# Patient Record
Sex: Male | Born: 1965 | Race: White | Hispanic: No | Marital: Married | Smoking: Never smoker
Health system: Southern US, Community
[De-identification: ages and names within clinical notes are randomized; demographics above are authoritative.]

## PROBLEM LIST (undated history)

## (undated) DIAGNOSIS — K219 Gastro-esophageal reflux disease without esophagitis: Secondary | ICD-10-CM

## (undated) DIAGNOSIS — M069 Rheumatoid arthritis, unspecified: Secondary | ICD-10-CM

## (undated) HISTORY — PX: WISDOM TOOTH EXTRACTION: SHX21

## (undated) HISTORY — PX: ESOPHAGOGASTRODUODENOSCOPY: SHX1529

---

## 2005-08-04 HISTORY — PX: VASECTOMY: SHX75

## 2009-08-28 ENCOUNTER — Ambulatory Visit: Payer: Self-pay | Admitting: Internal Medicine

## 2010-04-08 ENCOUNTER — Ambulatory Visit: Payer: Self-pay | Admitting: Otolaryngology

## 2010-07-29 ENCOUNTER — Ambulatory Visit: Payer: Self-pay | Admitting: Otolaryngology

## 2010-08-03 LAB — PATHOLOGY REPORT

## 2011-09-24 IMAGING — CT CT NECK WITH CONTRAST
1 of 2 series · 10 of 14 positions shown, 13 images · IV contrast (agent unspecified)
Comparison: none

REASON FOR EXAM: R SUBMANDIBULAR MASS
COMMENTS:

PROCEDURE:     CT  - CT NECK WITH CONTRAST  - April 08, 2010  [DATE]
RESULT:
TECHNIQUE: Helical 3 mm sections were obtained from the skull base through
the thoracic inlet status post intravenous administration of 75 mL
[id number].

[Series 2: soft tissue · axial · 0.48mm/px · z∈[+122,+338]mm · 10 of 89 slices shown, 13 images]
[im 9/89  soft-tissue]
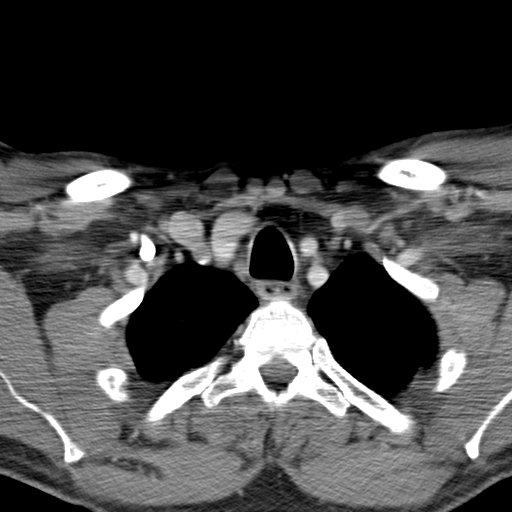
[im 9/89  bone]
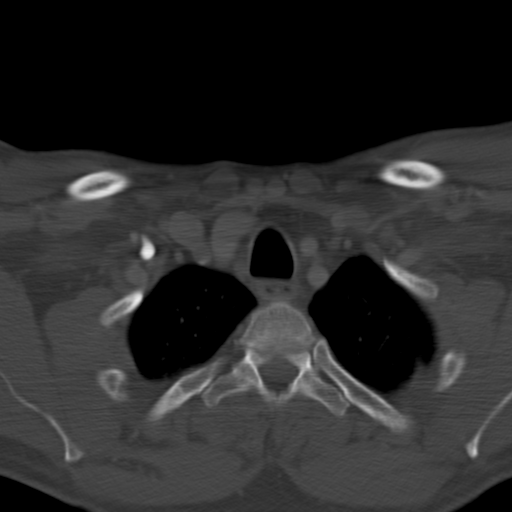
[im 17/89  bone]
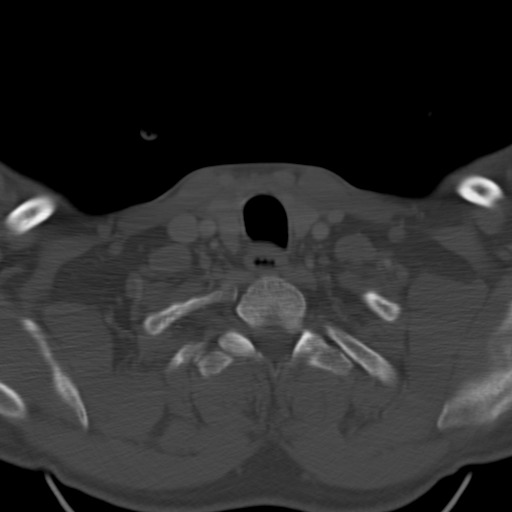
[im 25/89  bone]
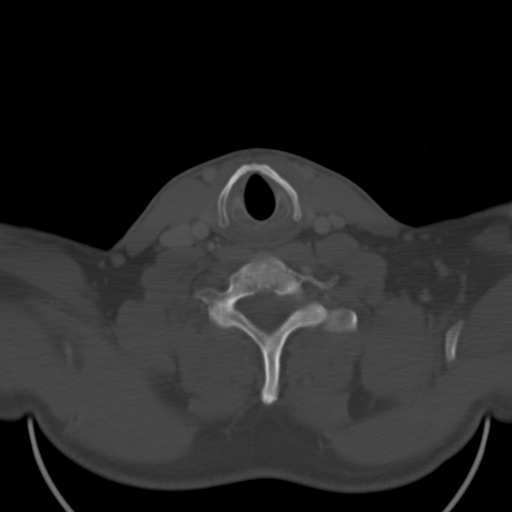
[im 33/89  bone]
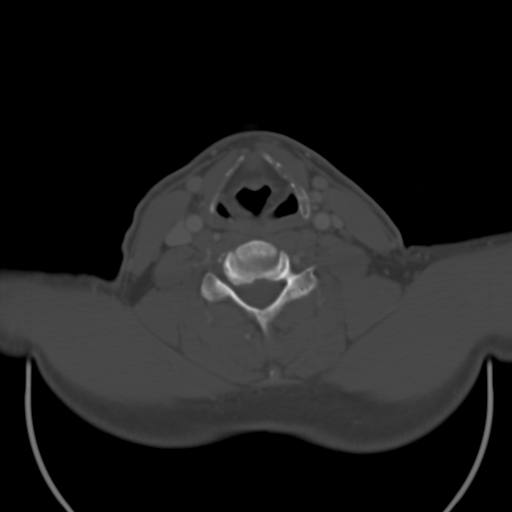
[im 41/89  soft-tissue]
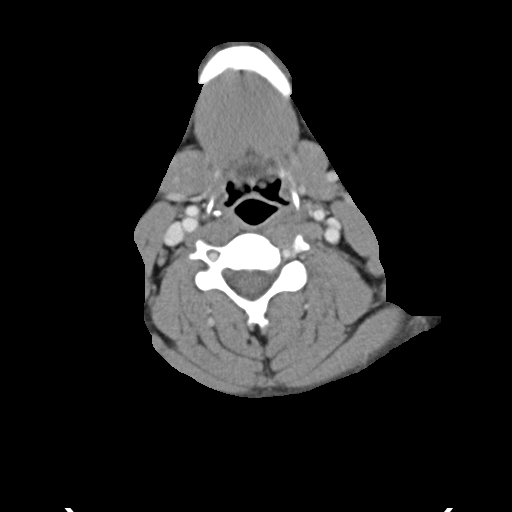
[im 41/89  bone]
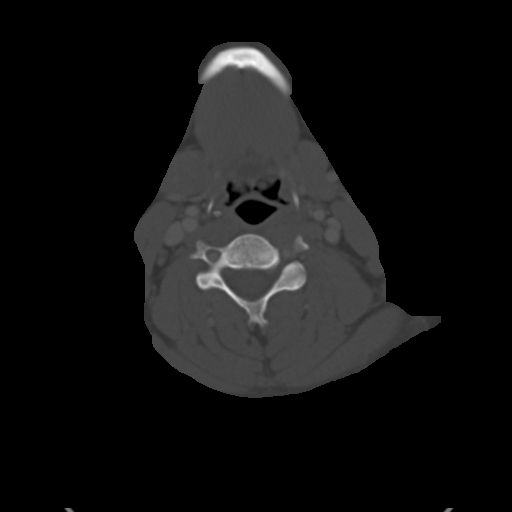
[im 49/89  bone]
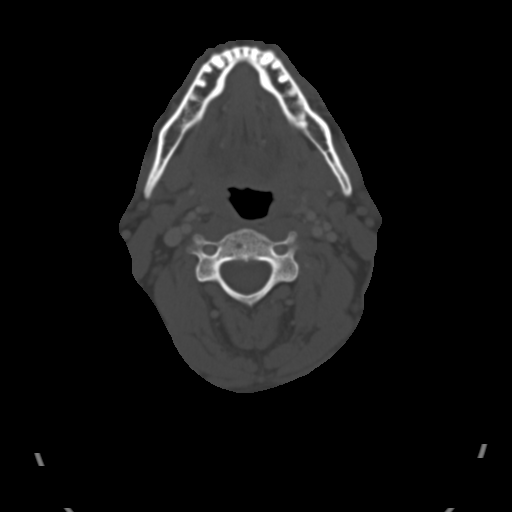
[im 57/89  bone]
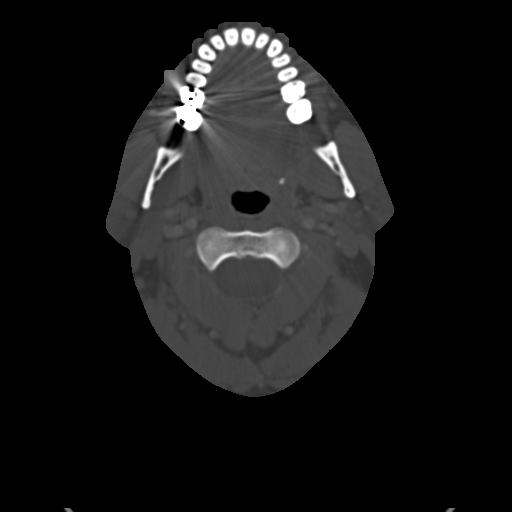
[im 65/89  bone]
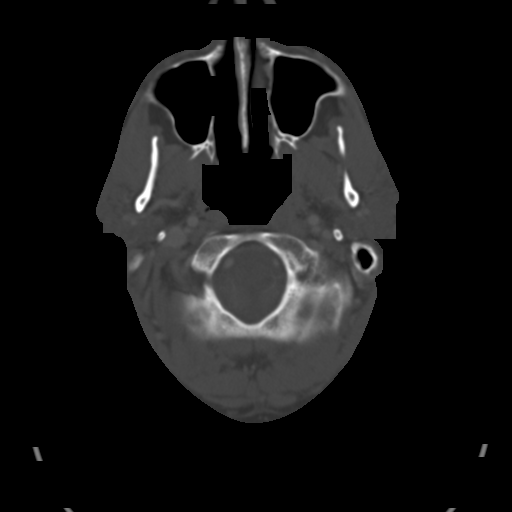
[im 73/89  soft-tissue]
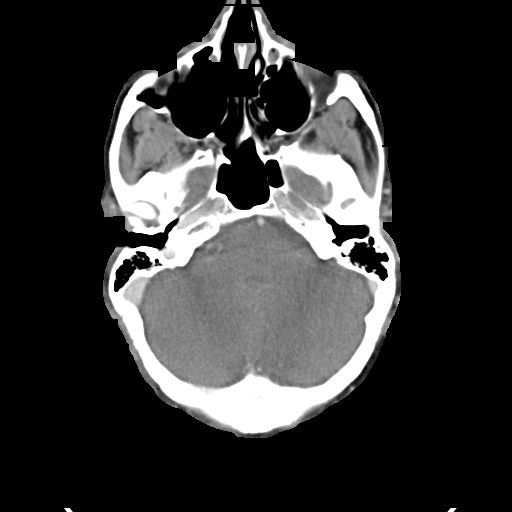
[im 73/89  bone]
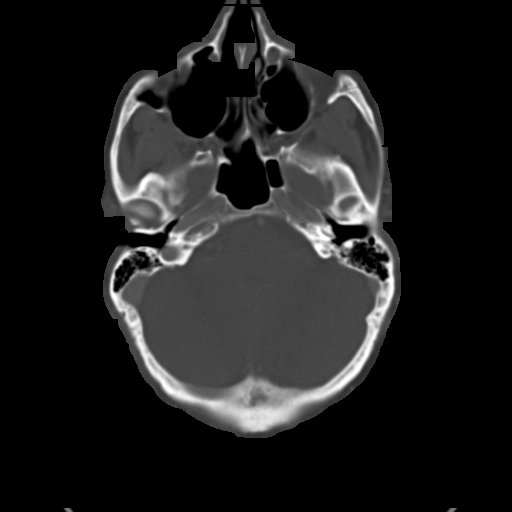
[im 81/89  bone]
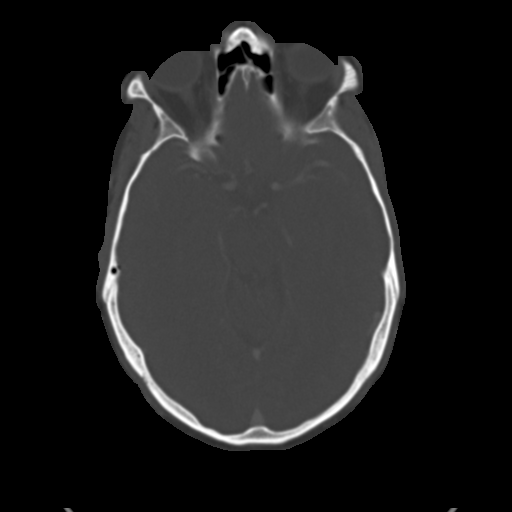

[10 of 14 positions shown; findings below may reference images not displayed]

FINDINGS: Evaluation of the right submandibular region demonstrates diffuse
prominence of the right submandibular gland which is slightly larger than
the left. No definite masses or pathologic size adenopathy is appreciated.
There is no evidence of free fluid or loculated fluid collections. Shotty
subcentimeter lymph nodes are identified within the anterior and posterior
cervical chains. The largest projects within the anterior carotid space on
the right. A rounded dystrophic appearing calcification projects within the
anterior left parapharyngeal space. This does not appear to project along
the expected course of the carotid duct nor the submandibular duct. This may
represent dystrophic calcification within soft tissues. There is no evidence
of masses, free fluid or loculated fluid collections.
IMPRESSION: Asymmetry of the submandibular glands right larger than the left. No
definite masses nor nodules are identified, nor free fluid or loculated
fluid collections. Shotty subcentimeter lymph nodes are identified within
the anterior and posterior cervical chains.

## 2011-12-22 DIAGNOSIS — M059 Rheumatoid arthritis with rheumatoid factor, unspecified: Secondary | ICD-10-CM | POA: Insufficient documentation

## 2012-02-08 ENCOUNTER — Ambulatory Visit: Payer: Self-pay

## 2013-07-26 IMAGING — CR DG CHEST 1V
1 series · 1 of 1 positions shown · non-contrast
Comparison: none

REASON FOR EXAM: r/o TB, pt with RA (714.0),FAX TO DR.ROBERTO TIGER
[phone]
COMMENTS:

PROCEDURE:     DXR - DXR CHEST 1 VIEWAP OR PA  - February 08, 2012  [DATE]
RESULT:     The lungs are clear. The cardiovascular structures are
unremarkable.

[w chest pa]
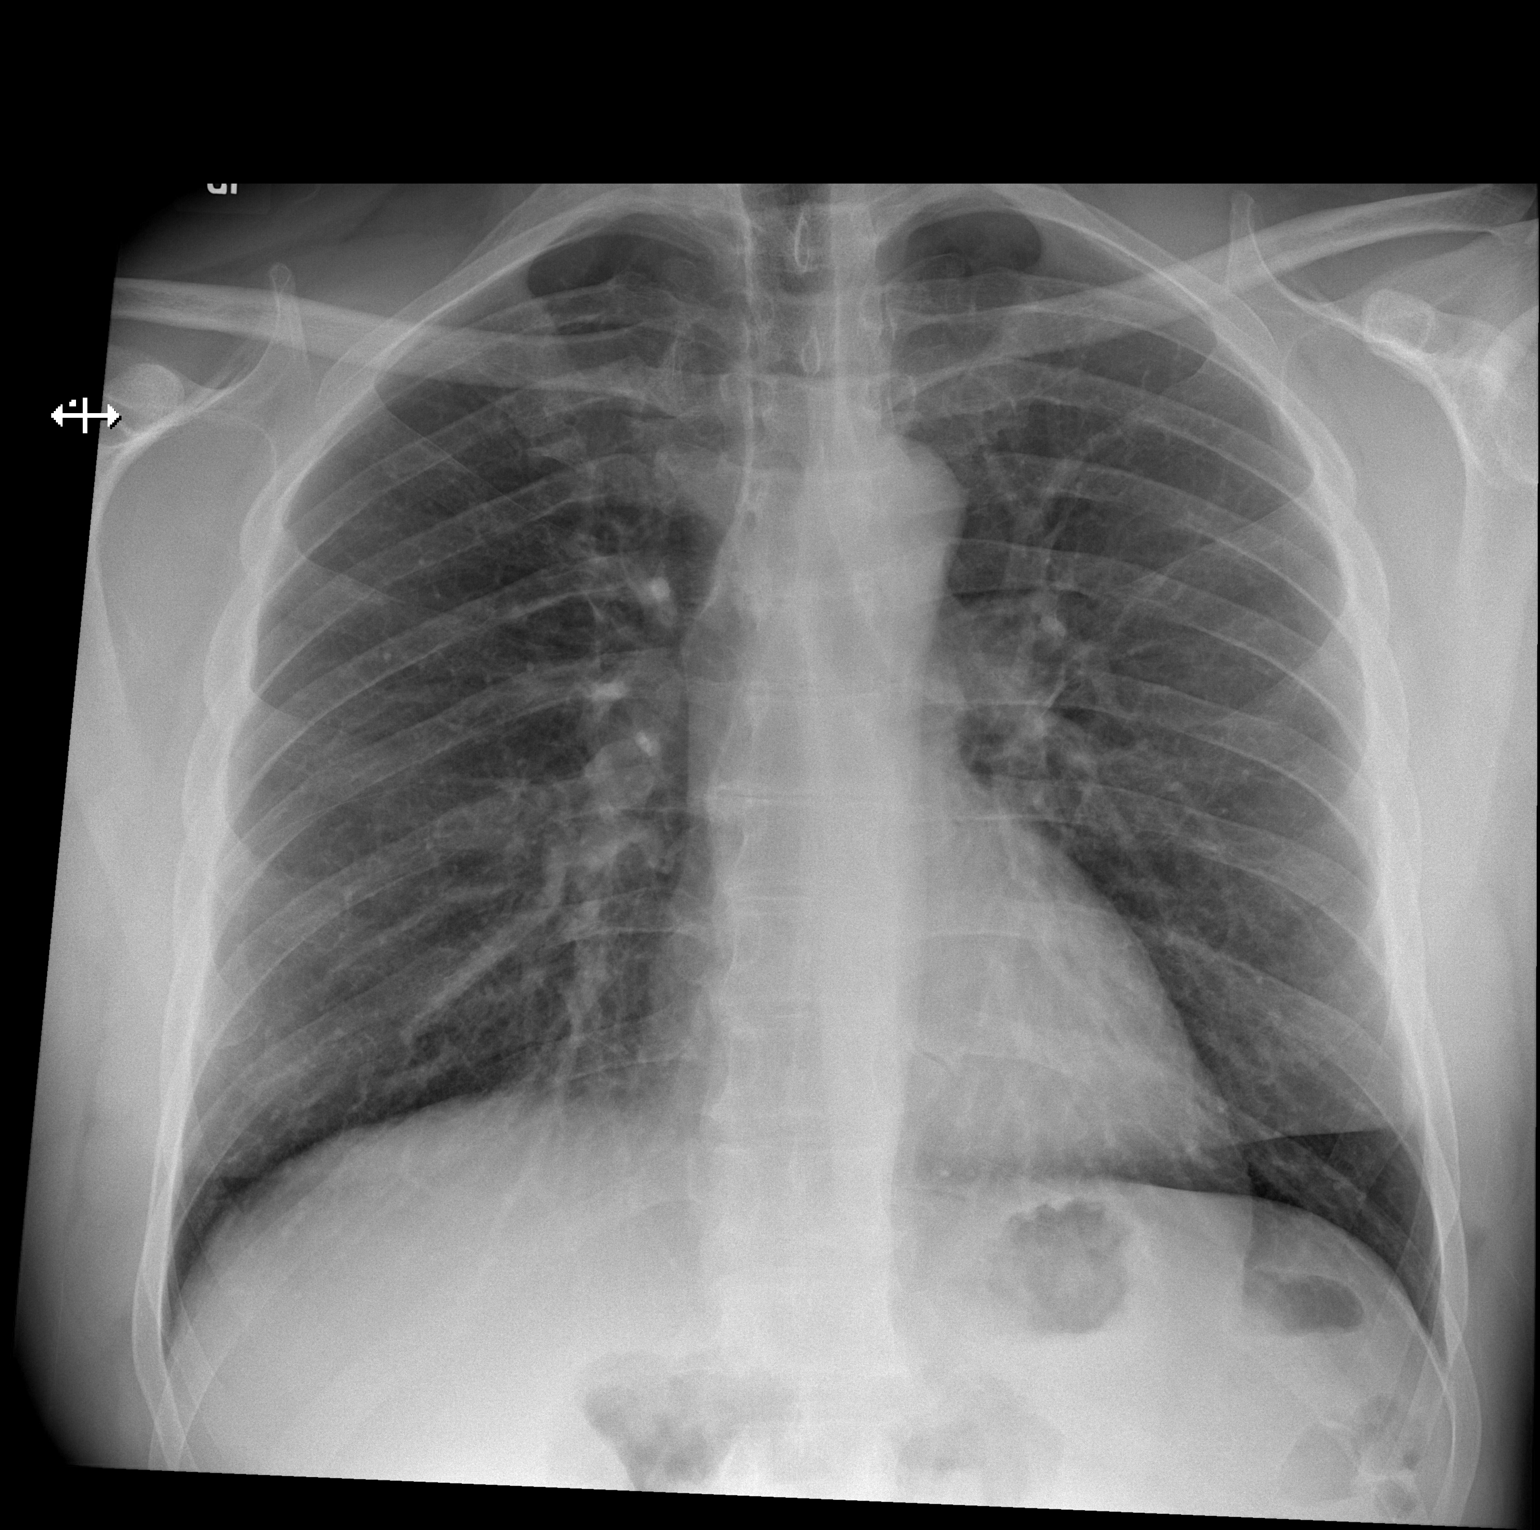

[1 of 1 positions shown; findings below may reference images not displayed]

IMPRESSION: No acute abnormality.

## 2015-05-19 DIAGNOSIS — R55 Syncope and collapse: Secondary | ICD-10-CM | POA: Insufficient documentation

## 2015-05-19 DIAGNOSIS — S0001XA Abrasion of scalp, initial encounter: Secondary | ICD-10-CM | POA: Insufficient documentation

## 2015-05-19 DIAGNOSIS — R799 Abnormal finding of blood chemistry, unspecified: Secondary | ICD-10-CM | POA: Insufficient documentation

## 2015-05-19 DIAGNOSIS — I4510 Unspecified right bundle-branch block: Secondary | ICD-10-CM | POA: Insufficient documentation

## 2015-05-19 DIAGNOSIS — K921 Melena: Secondary | ICD-10-CM | POA: Insufficient documentation

## 2017-03-21 DIAGNOSIS — Z79899 Other long term (current) drug therapy: Secondary | ICD-10-CM | POA: Insufficient documentation

## 2017-12-15 ENCOUNTER — Telehealth: Payer: Self-pay | Admitting: Gastroenterology

## 2017-12-15 NOTE — Telephone Encounter (Signed)
Patient called back to schedule colonoscopy.  ?

## 2017-12-20 ENCOUNTER — Telehealth: Payer: Self-pay

## 2017-12-20 NOTE — Telephone Encounter (Signed)
LVM on both home and cell numbers for patient callback to schedule a colonoscopy.

## 2018-01-06 ENCOUNTER — Encounter: Payer: Self-pay | Admitting: *Deleted

## 2018-07-04 ENCOUNTER — Telehealth: Payer: Self-pay

## 2018-07-04 NOTE — Telephone Encounter (Signed)
Returned patients call for colonoscopy.  He wants it scheduled for 12/30.  I advised him that we have Dr. Bonna Gains scoping at [facility] and Dr. Allen Norris will be at [facility].  He will call back to let me know which physician he would like to have his procedure with.  Thanks [employer]

## 2018-07-12 ENCOUNTER — Telehealth: Payer: Self-pay

## 2018-07-12 ENCOUNTER — Other Ambulatory Visit: Payer: Self-pay

## 2018-07-12 DIAGNOSIS — Z1211 Encounter for screening for malignant neoplasm of colon: Secondary | ICD-10-CM

## 2018-07-12 NOTE — Telephone Encounter (Signed)
Patient LVM stating that he would like his colonoscopy to be scheduled at [facility].  Based on our previous conversation the date is 10/02/18.    I returned patients call to let him know that I've received his message and will place instructions and rx in the mail for him.  Thanks [employer]

## 2018-09-29 ENCOUNTER — Other Ambulatory Visit: Payer: Self-pay

## 2018-10-02 ENCOUNTER — Ambulatory Visit
Admission: RE | Admit: 2018-10-02 | Discharge: 2018-10-02 | Disposition: A | Discharge status: Home / Self Care | Payer: Managed Care, Other (non HMO) | Attending: Gastroenterology | Admitting: Gastroenterology

## 2018-10-02 ENCOUNTER — Ambulatory Visit: Payer: Managed Care, Other (non HMO) | Admitting: Anesthesiology

## 2018-10-02 ENCOUNTER — Other Ambulatory Visit: Payer: Self-pay

## 2018-10-02 ENCOUNTER — Encounter: Payer: Self-pay | Admitting: *Deleted

## 2018-10-02 ENCOUNTER — Encounter
Admission: RE | Disposition: A | Discharge status: Home / Self Care | Payer: Self-pay | Source: Home / Self Care | Attending: Gastroenterology

## 2018-10-02 DIAGNOSIS — K219 Gastro-esophageal reflux disease without esophagitis: Secondary | ICD-10-CM | POA: Diagnosis not present

## 2018-10-02 DIAGNOSIS — D124 Benign neoplasm of descending colon: Secondary | ICD-10-CM | POA: Diagnosis not present

## 2018-10-02 DIAGNOSIS — M069 Rheumatoid arthritis, unspecified: Secondary | ICD-10-CM | POA: Insufficient documentation

## 2018-10-02 DIAGNOSIS — Q438 Other specified congenital malformations of intestine: Secondary | ICD-10-CM | POA: Insufficient documentation

## 2018-10-02 DIAGNOSIS — Z79899 Other long term (current) drug therapy: Secondary | ICD-10-CM | POA: Insufficient documentation

## 2018-10-02 DIAGNOSIS — Z1211 Encounter for screening for malignant neoplasm of colon: Secondary | ICD-10-CM | POA: Diagnosis not present

## 2018-10-02 HISTORY — DX: Rheumatoid arthritis, unspecified: M06.9

## 2018-10-02 HISTORY — PX: COLONOSCOPY WITH PROPOFOL: SHX5780

## 2018-10-02 HISTORY — DX: Gastro-esophageal reflux disease without esophagitis: K21.9

## 2018-10-02 SURGERY — COLONOSCOPY WITH PROPOFOL
Anesthesia: General

## 2018-10-02 MED ORDER — PROPOFOL 10 MG/ML IV BOLUS
INTRAVENOUS | Status: DC | PRN
  Administered 2018-10-02: 80 mg via INTRAVENOUS
  Administered 2018-10-02: 100 mg via INTRAVENOUS

## 2018-10-02 MED ORDER — PROPOFOL 500 MG/50ML IV EMUL
INTRAVENOUS | Status: DC | PRN
  Administered 2018-10-02: 150 ug/kg/min via INTRAVENOUS

## 2018-10-02 MED ORDER — SODIUM CHLORIDE 0.9 % IV SOLN
INTRAVENOUS | Status: DC
  Administered 2018-10-02: 09:00:00 via INTRAVENOUS

## 2018-10-02 MED ORDER — PROPOFOL 10 MG/ML IV BOLUS
INTRAVENOUS | Status: AC
  Filled 2018-10-02: qty 20

## 2018-10-02 MED ORDER — LIDOCAINE 2% (20 MG/ML) 5 ML SYRINGE
INTRAMUSCULAR | Status: DC | PRN
  Administered 2018-10-02: 60 mg via INTRAVENOUS

## 2018-10-02 MED ORDER — PROPOFOL 500 MG/50ML IV EMUL
INTRAVENOUS | Status: AC
  Filled 2018-10-02: qty 50

## 2018-10-02 NOTE — Anesthesia Postprocedure Evaluation (Signed)
Anesthesia Post Note  Patient: Mason Padilla  Procedure(s) Performed: COLONOSCOPY WITH PROPOFOL (N/A )  Patient location during evaluation: Endoscopy Anesthesia Type: General Level of consciousness: awake and alert Pain management: pain level controlled Vital Signs Assessment: post-procedure vital signs reviewed and stable Respiratory status: spontaneous breathing, nonlabored ventilation, respiratory function stable and patient connected to nasal cannula oxygen Cardiovascular status: blood pressure returned to baseline and stable Postop Assessment: no apparent nausea or vomiting Anesthetic complications: no     Last Vitals:  Vitals:   10/02/18 1017 10/02/18 1027  BP: (!) 105/54 (!) 127/91  Pulse: 63 72  Resp: 13 18  Temp:    SpO2: 97% 99%    Last Pain:  Vitals:   10/02/18 1027  TempSrc:   PainSc: 0-No pain                 Martha Clan

## 2018-10-02 NOTE — H&P (Signed)
Vonda Antigua, MD [address] [place], [place], [place], [address] [address], [place], [place], [place], [address] Phone: [phone]  Fax: [phone]  Primary Care Physician:  Juluis Pitch, MD   Pre-Procedure History & Physical: HPI:  Mason Padilla is a 52 y.o. male is here for a colonoscopy.   Past Medical History:  Diagnosis Date  . GERD (gastroesophageal reflux disease)   . RA (rheumatoid arthritis) ([place])     Past Surgical History:  Procedure Laterality Date  . ESOPHAGOGASTRODUODENOSCOPY    . VASECTOMY  08/2005  . WISDOM TOOTH EXTRACTION      Prior to Admission medications   Medication Sig Start Date End Date Taking? Authorizing Provider  Adalimumab 40 MG/0.8ML PNKT One injection SQ every other weeks, 12 weeks 12/18/13  Yes [provider]  folic acid (FOLVITE) 1 MG tablet Take by mouth. 11/16/13  Yes [provider]  methotrexate (RHEUMATREX) 2.5 MG tablet Take 6  tablets po q week with meals, 12 weeks 11/16/13  Yes [provider]  pantoprazole (PROTONIX) 40 MG tablet TAKE 1 TABLET BY MOUTH ONCE DAILY 08/01/18  Yes [provider]    Allergies as of 07/12/2018  . (Not on File)    History reviewed. No pertinent family history.  Social History   Socioeconomic History  . Marital status: Married    Spouse name: Not on file  . Number of children: Not on file  . Years of education: Not on file  . Highest education level: Not on file  Occupational History  . Not on file  Social Needs  . Financial resource strain: Not on file  . Food insecurity:    Worry: Not on file    Inability: Not on file  . Transportation needs:    Medical: Not on file    Non-medical: Not on file  Tobacco Use  . Smoking status: Never Smoker  . Smokeless tobacco: Never Used  Substance and Sexual Activity  . Alcohol use: Yes    Alcohol/week: 6.0 standard drinks    Types: 6 Cans of beer per week  . Drug use: Never  . Sexual  activity: Not on file  Lifestyle  . Physical activity:    Days per week: Not on file    Minutes per session: Not on file  . Stress: Not on file  Relationships  . Social connections:    Talks on phone: Not on file    Gets together: Not on file    Attends religious service: Not on file    Active member of club or organization: Not on file    Attends meetings of clubs or organizations: Not on file    Relationship status: Not on file  . Intimate partner violence:    Fear of current or ex partner: Not on file    Emotionally abused: Not on file    Physically abused: Not on file    Forced sexual activity: Not on file  Other Topics Concern  . Not on file  Social History Narrative  . Not on file    Review of Systems: See HPI, otherwise negative ROS  Physical Exam: BP (!) 150/82   Pulse 74   Temp (!) 96.3 F (35.7 C) (Tympanic)   Resp 15   Ht 6\' 3"  (1.905 m)   Wt 108.9 kg   SpO2 100%   BMI 30.00 kg/m  General:   Alert,  pleasant and cooperative in NAD Head:  Normocephalic and atraumatic. Neck:  Supple; no masses or thyromegaly. Lungs:  Clear throughout to auscultation, normal respiratory effort.    Heart:  +S1, +S2, Regular rate and rhythm, No edema. Abdomen:  Soft, nontender and nondistended. Normal bowel sounds, without guarding, and without rebound.   Neurologic:  Alert and  oriented x4;  grossly normal neurologically.  Impression/Plan: Mason Padilla is here for a colonoscopy to be performed for average risk screening.  Risks, benefits, limitations, and alternatives regarding  colonoscopy have been reviewed with the patient.  Questions have been answered.  All parties agreeable.   [name], MD  10/02/2018, 9:14 AM

## 2018-10-02 NOTE — Transfer of Care (Signed)
Immediate Anesthesia Transfer of Care Note  Patient: Mason Padilla  Procedure(s) Performed: COLONOSCOPY WITH PROPOFOL (N/A )  Patient Location: Endoscopy Unit  Anesthesia Type:General  Level of Consciousness: sedated  Airway & Oxygen Therapy: Patient connected to nasal cannula oxygen  Post-op Assessment: Post -op Vital signs reviewed and stable  Post vital signs: Reviewed and stable  Last Vitals:  Vitals Value Taken Time  BP 102/56 10/02/2018 10:07 AM  Temp 36.4 C 10/02/2018 10:07 AM  Pulse 66 10/02/2018 10:11 AM  Resp 23 10/02/2018 10:11 AM  SpO2 97 % 10/02/2018 10:11 AM  Vitals shown include unvalidated device data.  Last Pain:  Vitals:   10/02/18 1007  TempSrc: Tympanic  PainSc: Asleep         Complications: No apparent anesthesia complications

## 2018-10-02 NOTE — Anesthesia Post-op Follow-up Note (Signed)
Anesthesia QCDR form completed.        

## 2018-10-02 NOTE — Op Note (Signed)
[facility] Gastroenterology Patient Name: Mason Padilla Procedure Date: 10/02/2018 9:19 AM MRN: [id number] Account #: [id number] Date of Birth: October 15, 1965 Admit Type: Outpatient Age: 52 Room: [facility] ENDO ROOM 4 Gender: Male Note Status: Finalized Procedure:            Colonoscopy Indications:          Screening for colorectal malignant neoplasm Providers:            Glenisha Gundry B. Bonna Gains MD, MD Referring MD:         Youlanda Roys. Lovie Macadamia, MD (Referring MD) Medicines:            Monitored Anesthesia Care Complications:        No immediate complications. Procedure:            Pre-Anesthesia Assessment:                       - ASA Grade Assessment: II - A patient with mild                        systemic disease.                       - Prior to the procedure, a History and Physical was                        performed, and patient medications, allergies and                        sensitivities were reviewed. The patient's tolerance of                        previous anesthesia was reviewed.                       - The risks and benefits of the procedure and the                        sedation options and risks were discussed with the                        patient. All questions were answered and informed                        consent was obtained.                       - Patient identification and proposed procedure were                        verified prior to the procedure by the physician, the                        nurse, the anesthesiologist, the anesthetist and the                        technician. The procedure was verified in the procedure                        room.  After obtaining informed consent, the colonoscope was                        passed under direct vision. Throughout the procedure,                        the patient's blood pressure, pulse, and oxygen                        saturations were monitored continuously. The                         Colonoscope was introduced through the anus and                        advanced to the the cecum, identified by appendiceal                        orifice and ileocecal valve. The colonoscopy was                        performed with ease. The patient tolerated the                        procedure well. The quality of the bowel preparation                        was good. Findings:      The perianal and digital rectal examinations were normal.      A 4 mm polyp was found in the descending colon. The polyp was sessile.       The polyp was removed with a cold biopsy forceps. Resection and       retrieval were complete.      The colon (entire examined portion) was mildly tortuous. Advancing the       scope required withdrawing and reinserting the scope and applying       abdominal pressure.      The exam was otherwise without abnormality.      The rectum, sigmoid colon, descending colon, transverse colon, ascending       colon and cecum appeared normal.      The retroflexed view of the distal rectum and anal verge was normal and       showed no anal or rectal abnormalities. Impression:           - One 4 mm polyp in the descending colon, removed with                        a cold biopsy forceps. Resected and retrieved.                       - Tortuous colon.                       - The examination was otherwise normal.                       - The rectum, sigmoid colon, descending colon,                        transverse colon, ascending colon and cecum are  normal.                       - The distal rectum and anal verge are normal on                        retroflexion view. Recommendation:       - Discharge patient to home (with escort).                       - Advance diet as tolerated.                       - Continue present medications.                       - Await pathology results.                       - Repeat colonoscopy in 5 years.                        - The findings and recommendations were discussed with                        the patient.                       - The findings and recommendations were discussed with                        the patient's family.                       - Return to primary care physician as previously                        scheduled. Procedure Code(s):    --- Professional ---                       [phone], Colonoscopy, flexible; with biopsy, single or                        multiple Diagnosis Code(s):    --- Professional ---                       Z12.11, Encounter for screening for malignant neoplasm                        of colon                       D12.4, Benign neoplasm of descending colon                       Q43.8, Other specified congenital malformations of                        intestine CPT copyright 2018 American Medical Association. All rights reserved. The codes documented in this report are preliminary and upon coder review may  be revised to meet current compliance requirements.  Vonda Antigua, MD Margretta Sidle B. Bonna Gains MD, MD 10/02/2018 10:06:59 AM This report has been signed electronically. Number of Addenda: 0  Note Initiated On: 10/02/2018 9:19 AM Scope Withdrawal Time: 0 hours 11 minutes 35 seconds  Total Procedure Duration: 0 hours 28 minutes 25 seconds  Estimated Blood Loss: Estimated blood loss: none.      [facility]

## 2018-10-02 NOTE — Anesthesia Preprocedure Evaluation (Signed)
Anesthesia Evaluation  Patient identified by MRN, date of birth, ID band Patient awake    Reviewed: Allergy & Precautions, H&P , NPO status , Patient's Chart, lab work & pertinent test results, reviewed documented beta blocker date and time   History of Anesthesia Complications Negative for: history of anesthetic complications  Airway Mallampati: II  TM Distance: >3 FB Neck ROM: full    Dental  (+) Caps, Teeth Intact, Dental Advidsory Given   Pulmonary neg pulmonary ROS,           Cardiovascular Exercise Tolerance: Good negative cardio ROS       Neuro/Psych negative neurological ROS  negative psych ROS   GI/Hepatic Neg liver ROS, GERD  ,  Endo/Other  negative endocrine ROS  Renal/GU negative Renal ROS  negative genitourinary   Musculoskeletal   Abdominal   Peds  Hematology negative hematology ROS (+)   Anesthesia Other Findings Past Medical History: No date: GERD (gastroesophageal reflux disease) No date: RA (rheumatoid arthritis) ([facility])   Reproductive/Obstetrics negative OB ROS                             Anesthesia Physical Anesthesia Plan  ASA: II  Anesthesia Plan: General   Post-op Pain Management:    Induction: Intravenous  PONV Risk Score and Plan: 2 and Propofol infusion and TIVA  Airway Management Planned: Natural Airway and Nasal Cannula  Additional Equipment:   Intra-op Plan:   Post-operative Plan:   Informed Consent: I have reviewed the patients History and Physical, chart, labs and discussed the procedure including the risks, benefits and alternatives for the proposed anesthesia with the patient or authorized representative who has indicated his/her understanding and acceptance.   Dental Advisory Given  Plan Discussed with: Anesthesiologist, CRNA and Surgeon  Anesthesia Plan Comments:         Anesthesia Quick Evaluation

## 2018-10-03 ENCOUNTER — Encounter: Payer: Self-pay | Admitting: Gastroenterology

## 2018-10-03 LAB — SURGICAL PATHOLOGY

## 2019-01-19 ENCOUNTER — Encounter: Payer: Self-pay | Admitting: *Deleted

## 2019-03-07 ENCOUNTER — Ambulatory Visit: Payer: Managed Care, Other (non HMO) | Admitting: Gastroenterology

## 2019-03-22 ENCOUNTER — Ambulatory Visit: Payer: Managed Care, Other (non HMO) | Admitting: Gastroenterology

## 2021-07-07 ENCOUNTER — Encounter: Payer: Self-pay | Admitting: *Deleted

## 2021-09-09 ENCOUNTER — Ambulatory Visit (INDEPENDENT_AMBULATORY_CARE_PROVIDER_SITE_OTHER): Payer: Managed Care, Other (non HMO) | Admitting: Gastroenterology

## 2021-09-09 ENCOUNTER — Encounter: Payer: Self-pay | Admitting: Gastroenterology

## 2021-09-09 VITALS — BP 193/102 | HR 74 | Temp 98.9°F | Ht 75.0 in | Wt 244.0 lb

## 2021-09-09 DIAGNOSIS — K2270 Barrett's esophagus without dysplasia: Secondary | ICD-10-CM | POA: Diagnosis not present

## 2021-09-09 MED ORDER — PANTOPRAZOLE SODIUM 20 MG PO TBEC: 20.0000 mg | DELAYED_RELEASE_TABLET | Freq: Every day | ORAL | 1 refills | Status: AC

## 2021-09-09 NOTE — Progress Notes (Signed)
Mason Padilla [address]  [place]  [place], [place] [address]  Main: [phone]  Fax: [phone]   Gastroenterology Consultation  Referring Provider:     Adin Hector, MD Primary Care Physician:  Juluis Pitch, MD Reason for Consultation:     Barrett's esophagus        HPI:    Chief Complaint  Patient presents with   New Patient (Initial Visit)    Pt has EGD 09/2015, Patient is not having an concerns, just wanted to know if he needed to have a repeat EGD Pathology A: Stomach, GE junction, biopsy  - Intestinal metaplasia, consistent with Barrett's esophagus, negative for dysplasia     Barrett's esophagus  [name] is a 55 y.o. y/o male referred for consultation & management  by Dr. Juluis Pitch, MD. patient has previous history of Barrett's esophagus and is in clinic today to discuss if he needs surveillance.  Previous records reviewed in [place] and patient was admitted with upper GI bleed in 2016 to [facility].  EGD showed an esophageal ulcer at the time with follow-up EGD showing esophagitis.  Most recent EGD was December 2016 for follow-up of reflux esophagitis which showed a 2 cm hiatal hernia and changes suspicious of Barrett's esophagus.  Pathology showed intestinal metaplasia without dysplasia.  Esophageal biopsies were done in the middle and distal esophagus as well with no intraepithelial eosinophils identified no significant pathologic abnormality  Patient has been on PPI since then.  The patient denies abdominal or flank pain, anorexia, nausea or vomiting, dysphagia, change in bowel habits or black or bloody stools or weight loss.  Denies any symptoms of breakthrough reflux  Colonoscopy up-to-date in 2019.  Please see procedure report   Past Medical History:  Diagnosis Date   GERD (gastroesophageal reflux disease)    RA (rheumatoid arthritis) ([place])     Past Surgical History:  Procedure Laterality Date   COLONOSCOPY WITH  PROPOFOL N/A 10/02/2018   Procedure: COLONOSCOPY WITH PROPOFOL;  Surgeon: [name], MD;  Location: [facility] ENDOSCOPY;  Service: Gastroenterology;  Laterality: N/A;   ESOPHAGOGASTRODUODENOSCOPY     VASECTOMY  08/2005   WISDOM TOOTH EXTRACTION      Prior to Admission medications   Medication Sig Start Date End Date Taking? Authorizing Provider  Adalimumab 40 MG/0.8ML PNKT One injection SQ every other weeks, 12 weeks 12/18/13  Yes [provider]  folic acid (FOLVITE) 1 MG tablet Take by mouth. 11/16/13  Yes [provider]  methotrexate (RHEUMATREX) 2.5 MG tablet Take 6  tablets po q week with meals, 12 weeks 11/16/13  Yes [provider]  pantoprazole (PROTONIX) 20 MG tablet Take 1 tablet (20 mg total) by mouth daily. 09/09/21  Yes [name], MD    No family history on file.   Social History   Tobacco Use   Smoking status: Never   Smokeless tobacco: Never  Vaping Use   Vaping Use: Never used  Substance Use Topics   Alcohol use: Yes    Alcohol/week: 6.0 standard drinks    Types: 6 Cans of beer per week   Drug use: Never    Allergies as of 09/09/2021   (No Known Allergies)    Review of Systems:    All systems reviewed and negative except where noted in HPI.   Physical Exam:  Constitutional: General:   Alert,  Well-developed, well-nourished, pleasant and cooperative in NAD BP (!) 193/102   Pulse 74  Temp 98.9 F (37.2 C) (Oral)   Ht 6\' 3"  (1.905 m)   Wt 244 lb (110.7 kg)   BMI 30.50 kg/m   Eyes:  Sclera clear, no icterus.   Conjunctiva pink. PERRLA  Ears:  No scars, lesions or masses, Normal auditory acuity. Nose:  No deformity, discharge, or lesions. Mouth:  No deformity or lesions, oropharynx pink & moist.  Neck:  Supple; no masses or thyromegaly.  Respiratory: Normal respiratory effort, Normal percussion  Gastrointestinal: Soft, non-tender and non-distended without masses, hepatosplenomegaly or hernias noted.  No  guarding or rebound tenderness.     Cardiac: No clubbing or edema.  No cyanosis. Normal posterior tibial pedal pulses noted.  Lymphatic:  No significant cervical or axillary adenopathy.  Psych:  Alert and cooperative. Normal mood and affect.  Musculoskeletal:  Normal gait. Head normocephalic, atraumatic. Symmetrical without gross deformities. 5/5 Upper and Lower extremity strength bilaterally.  Skin: Warm. Intact without significant lesions or rashes. No jaundice.  Neurologic:  Face symmetrical, tongue midline, Normal sensation to touch;  grossly normal neurologically.  Psych:  Alert and oriented x3, Alert and cooperative. Normal mood and affect.   Labs: CBC No results found for: WBC, RBC, HGB, HCT, PLT, MCV, MCH, MCHC, RDW, LYMPHSABS, MONOABS, EOSABS, BASOSABS CMP  No results found for: NA, K, CL, CO2, GLUCOSE, BUN, CREATININE, CALCIUM, PROT, ALBUMIN, AST, ALT, ALKPHOS, BILITOT, GFRNONAA, GFRAA  Imaging Studies: No results found.  Assessment and Plan:   Mason Padilla is a 55 y.o. y/o male has been referred for history of Barrett's esophagus  We discussed options of ongoing surveillance for Barrett's esophagus which is recommended in 3 years after his last upper endoscopy.  Options of no further surveillance were discussed as well and risks and benefits of both options discussed in detail  Patient prefers to proceed with Barrett's surveillance at this time  Patient is also willing to decrease his PPI dose as he is not having any breakthrough reflux symptoms.  However, if symptoms worsen on this dose patient advised to call us  Patient with Barrett's esophagus are recommended to be on PPI therapy indefinitely based on guidelines and this was discussed as well  (Risks of PPI use were discussed with patient including bone loss, C. Diff diarrhea, pneumonia, infections, CKD, electrolyte abnormalities.  Pt. Verbalizes understanding and chooses to continue the medication.)  I  have discussed alternative options, risks & benefits,  which include, but are not limited to, bleeding, infection, perforation,respiratory complication & drug reaction.  The patient agrees with this plan & written consent will be obtained.    Antireflux lifestyle measures discussed as well    Dr Mason Padilla  Speech recognition software was used to dictate the above note.

## 2021-10-01 ENCOUNTER — Encounter: Payer: Self-pay | Admitting: Gastroenterology

## 2021-10-02 ENCOUNTER — Ambulatory Visit: Payer: Managed Care, Other (non HMO) | Admitting: Anesthesiology

## 2021-10-02 ENCOUNTER — Encounter
Admission: RE | Disposition: A | Discharge status: Home / Self Care | Payer: Self-pay | Source: Home / Self Care | Attending: Gastroenterology

## 2021-10-02 ENCOUNTER — Ambulatory Visit
Admission: RE | Admit: 2021-10-02 | Discharge: 2021-10-02 | Disposition: A | Discharge status: Home / Self Care | Payer: Managed Care, Other (non HMO) | Attending: Gastroenterology | Admitting: Gastroenterology

## 2021-10-02 ENCOUNTER — Encounter: Payer: Self-pay | Admitting: Gastroenterology

## 2021-10-02 DIAGNOSIS — M069 Rheumatoid arthritis, unspecified: Secondary | ICD-10-CM | POA: Insufficient documentation

## 2021-10-02 DIAGNOSIS — K2930 Chronic superficial gastritis without bleeding: Secondary | ICD-10-CM | POA: Diagnosis not present

## 2021-10-02 DIAGNOSIS — K219 Gastro-esophageal reflux disease without esophagitis: Secondary | ICD-10-CM | POA: Insufficient documentation

## 2021-10-02 DIAGNOSIS — K317 Polyp of stomach and duodenum: Secondary | ICD-10-CM | POA: Diagnosis not present

## 2021-10-02 DIAGNOSIS — K2270 Barrett's esophagus without dysplasia: Secondary | ICD-10-CM | POA: Diagnosis present

## 2021-10-02 DIAGNOSIS — K3189 Other diseases of stomach and duodenum: Secondary | ICD-10-CM | POA: Diagnosis not present

## 2021-10-02 DIAGNOSIS — K319 Disease of stomach and duodenum, unspecified: Secondary | ICD-10-CM | POA: Diagnosis not present

## 2021-10-02 HISTORY — PX: ESOPHAGOGASTRODUODENOSCOPY (EGD) WITH PROPOFOL: SHX5813

## 2021-10-02 SURGERY — ESOPHAGOGASTRODUODENOSCOPY (EGD) WITH PROPOFOL
Anesthesia: General

## 2021-10-02 MED ORDER — PROPOFOL 10 MG/ML IV BOLUS
INTRAVENOUS | Status: DC | PRN
  Administered 2021-10-02: 80 mg via INTRAVENOUS
  Administered 2021-10-02: 20 mg via INTRAVENOUS
  Administered 2021-10-02: 40 mg via INTRAVENOUS
  Administered 2021-10-02: 20 mg via INTRAVENOUS

## 2021-10-02 MED ORDER — DEXMEDETOMIDINE (PRECEDEX) IN NS 20 MCG/5ML (4 MCG/ML) IV SYRINGE
PREFILLED_SYRINGE | INTRAVENOUS | Status: DC | PRN
  Administered 2021-10-02: 8 ug via INTRAVENOUS

## 2021-10-02 MED ORDER — PROPOFOL 500 MG/50ML IV EMUL
INTRAVENOUS | Status: DC | PRN
  Administered 2021-10-02: 150 ug/kg/min via INTRAVENOUS

## 2021-10-02 MED ORDER — LIDOCAINE HCL (CARDIAC) PF 100 MG/5ML IV SOSY
PREFILLED_SYRINGE | INTRAVENOUS | Status: DC | PRN
  Administered 2021-10-02: 50 mg via INTRAVENOUS

## 2021-10-02 MED ORDER — SODIUM CHLORIDE 0.9 % IV SOLN: INTRAVENOUS | Status: DC

## 2021-10-02 NOTE — H&P (Signed)
Mason Antigua, MD [address], [place], [place], [place], [address] [address], [place], [place], [place], [address] Phone: [phone]  Fax: [phone]  Primary Care Physician:  Adin Hector, MD   Pre-Procedure History & Physical: HPI:  Mason Padilla is a 55 y.o. male is here for an EGD.   Past Medical History:  Diagnosis Date   GERD (gastroesophageal reflux disease)    RA (rheumatoid arthritis) ([place])     Past Surgical History:  Procedure Laterality Date   COLONOSCOPY WITH PROPOFOL N/A 10/02/2018   Procedure: COLONOSCOPY WITH PROPOFOL;  Surgeon: [name], MD;  Location: [facility] ENDOSCOPY;  Service: Gastroenterology;  Laterality: N/A;   ESOPHAGOGASTRODUODENOSCOPY     VASECTOMY  08/2005   WISDOM TOOTH EXTRACTION      Prior to Admission medications   Medication Sig Start Date End Date Taking? Authorizing Provider  Adalimumab 40 MG/0.8ML PNKT One injection SQ every other weeks, 12 weeks 12/18/13   [provider]  folic acid (FOLVITE) 1 MG tablet Take by mouth. 11/16/13   [provider]  methotrexate (RHEUMATREX) 2.5 MG tablet Take 6  tablets po q week with meals, 12 weeks 11/16/13   [provider]  pantoprazole (PROTONIX) 20 MG tablet Take 1 tablet (20 mg total) by mouth daily. 09/09/21   [name], MD    Allergies as of 09/09/2021   (No Known Allergies)    History reviewed. No pertinent family history.  Social History   Socioeconomic History   Marital status: Married    Spouse name: Not on file   Number of children: Not on file   Years of education: Not on file   Highest education level: Not on file  Occupational History   Not on file  Tobacco Use   Smoking status: Never   Smokeless tobacco: Never  Vaping Use   Vaping Use: Never used  Substance and Sexual Activity   Alcohol use: Yes    Alcohol/week: 6.0 standard drinks    Types: 6 Cans of beer per week   Drug use: Never   Sexual activity:  Not on file  Other Topics Concern   Not on file  Social History Narrative   Not on file   Social Determinants of Health   Financial Resource Strain: Not on file  Food Insecurity: Not on file  Transportation Needs: Not on file  Physical Activity: Not on file  Stress: Not on file  Social Connections: Not on file  Intimate Partner Violence: Not on file    Review of Systems: See HPI, otherwise negative ROS  Constitutional: General:   Alert,  Well-developed, well-nourished, pleasant and cooperative in NAD BP (!) 174/88    Pulse 87    Temp 97.7 F (36.5 C) (Temporal)    Resp 18    Ht 6\' 3"  (1.905 m)    Wt 108.9 kg    SpO2 100%    BMI 30.00 kg/m   Head: Normocephalic, atraumatic.   Eyes:  Sclera clear, no icterus.   Conjunctiva pink.   Mouth:  No deformity or lesions, oropharynx pink & moist.  Neck:  Supple, trachea midline  Respiratory: Normal respiratory effort  Gastrointestinal:  Soft, non-tender and non-distended without masses, hepatosplenomegaly or hernias noted.  No guarding or rebound tenderness.     Cardiac: No clubbing or edema.  No cyanosis. Normal posterior tibial pedal pulses noted.  Lymphatic:  No significant cervical adenopathy.  Psych:  Alert and cooperative. Normal mood and  affect.  Musculoskeletal:   Symmetrical without gross deformities. 5/5 Lower extremity strength bilaterally.  Skin: Warm. Intact without significant lesions or rashes. No jaundice.  Neurologic:  Face symmetrical, tongue midline, Normal sensation to touch;  grossly normal neurologically.  Psych:  Alert and oriented x3, Alert and cooperative. Normal mood and affect.  Impression/Plan: Mason Padilla is here for an EGD for barretts esophagus  Risks, benefits, limitations, and alternatives regarding the procedure have been reviewed with the patient.  Questions have been answered.  All parties agreeable.   [name], MD  10/02/2021, 11:39 AM

## 2021-10-02 NOTE — Anesthesia Preprocedure Evaluation (Addendum)
Anesthesia Evaluation  Patient identified by MRN, date of birth, ID band Patient awake    Reviewed: Allergy & Precautions, H&P , NPO status , Patient's Chart, lab work & pertinent test results, reviewed documented beta blocker date and time   History of Anesthesia Complications Negative for: history of anesthetic complications  Airway Mallampati: II  TM Distance: >3 FB Neck ROM: full    Dental  (+) Caps, Teeth Intact, Dental Advidsory Given   Pulmonary neg pulmonary ROS,    Pulmonary exam normal        Cardiovascular Exercise Tolerance: Good Normal cardiovascular exam+ dysrhythmias (incomplete RBBB)  Rhythm:Regular Rate:Normal     Neuro/Psych negative neurological ROS  negative psych ROS   GI/Hepatic Neg liver ROS, GERD  Medicated and Controlled,  Endo/Other  negative endocrine ROS  Renal/GU negative Renal ROS  negative genitourinary   Musculoskeletal  (+) Arthritis , Rheumatoid disorders,    Abdominal Normal abdominal exam  (+)   Peds  Hematology negative hematology ROS (+)   Anesthesia Other Findings Past Medical History: No date: GERD (gastroesophageal reflux disease) No date: RA (rheumatoid arthritis) ([facility])   Reproductive/Obstetrics negative OB ROS                            Anesthesia Physical  Anesthesia Plan  ASA: II  Anesthesia Plan: General   Post-op Pain Management:    Induction: Intravenous  PONV Risk Score and Plan: 2 and Propofol infusion and TIVA  Airway Management Planned: Natural Airway and Nasal Cannula  Additional Equipment:   Intra-op Plan:   Post-operative Plan:   Informed Consent: I have reviewed the patients History and Physical, chart, labs and discussed the procedure including the risks, benefits and alternatives for the proposed anesthesia with the patient or authorized representative who has indicated his/her understanding and acceptance.      Dental advisory given  Plan Discussed with: Anesthesiologist, CRNA and Surgeon  Anesthesia Plan Comments:        Anesthesia Quick Evaluation

## 2021-10-02 NOTE — Anesthesia Postprocedure Evaluation (Signed)
Anesthesia Post Note  Patient: Mason Padilla  Procedure(s) Performed: ESOPHAGOGASTRODUODENOSCOPY (EGD) WITH PROPOFOL  Patient location during evaluation: Endoscopy Anesthesia Type: General Level of consciousness: awake and alert Pain management: pain level controlled Vital Signs Assessment: post-procedure vital signs reviewed and stable Respiratory status: spontaneous breathing, nonlabored ventilation and respiratory function stable Cardiovascular status: blood pressure returned to baseline and stable Postop Assessment: no apparent nausea or vomiting Anesthetic complications: no   No notable events documented.   Last Vitals:  Vitals:   10/02/21 1203 10/02/21 1223  BP: (!) 151/77 (!) 153/93  Pulse: 86 80  Resp: 14 18  Temp: (!) 36.1 C   SpO2: 98% 98%    Last Pain:  Vitals:   10/02/21 1223  TempSrc:   PainSc: 0-No pain                 [place] Ouch

## 2021-10-02 NOTE — Transfer of Care (Signed)
Immediate Anesthesia Transfer of Care Note  Patient: Mason Padilla  Procedure(s) Performed: ESOPHAGOGASTRODUODENOSCOPY (EGD) WITH PROPOFOL  Patient Location: PACU  Anesthesia Type:General  Level of Consciousness: awake, alert  and oriented  Airway & Oxygen Therapy: Patient Spontanous Breathing  Post-op Assessment: Report given to RN and Post -op Vital signs reviewed and stable  Post vital signs: Reviewed and stable  Last Vitals:  Vitals Value Taken Time  BP 151/77 10/02/21 1204  Temp 36.1 C 10/02/21 1203  Pulse 88 10/02/21 1204  Resp 16 10/02/21 1204  SpO2 95 % 10/02/21 1204  Vitals shown include unvalidated device data.  Last Pain:  Vitals:   10/02/21 1203  TempSrc: Temporal  PainSc: 0-No pain         Complications: No notable events documented.

## 2021-10-02 NOTE — Op Note (Signed)
[facility] Gastroenterology Patient Name: Russ Looper Procedure Date: 10/02/2021 11:26 AM MRN: [id number] Account #: [id number] Date of Birth: 1966/03/24 Admit Type: Outpatient Age: 55 Room: [facility] ENDO ROOM 1 Gender: Male Note Status: Finalized Instrument Name: Upper Endoscope [id number] Procedure:             Upper GI endoscopy Indications:           Follow-up of Barrett's esophagus Providers:             Zachory Mangual B. Bonna Gains MD, MD Referring MD:          Youlanda Roys. Lovie Macadamia, MD (Referring MD) Medicines:             Monitored Anesthesia Care Complications:         No immediate complications. Procedure:             Pre-Anesthesia Assessment:                        - Prior to the procedure, a History and Physical was                         performed, and patient medications, allergies and                         sensitivities were reviewed. The patient's tolerance                         of previous anesthesia was reviewed.                        - The risks and benefits of the procedure and the                         sedation options and risks were discussed with the                         patient. All questions were answered and informed                         consent was obtained.                        - Patient identification and proposed procedure were                         verified prior to the procedure by the physician, the                         nurse, the anesthesiologist, the anesthetist and the                         technician. The procedure was verified in the                         procedure room.                        - ASA Grade Assessment: II - A patient with mild  systemic disease.                        After obtaining informed consent, the endoscope was                         passed under direct vision. Throughout the procedure,                         the patient's blood pressure, pulse, and oxygen                          saturations were monitored continuously. The Endoscope                         was introduced through the mouth, and advanced to the                         second part of duodenum. The upper GI endoscopy was                         accomplished with ease. The patient tolerated the                         procedure well. Findings:      There were esophageal mucosal changes classified as Barrett's stage       C0-M1 per Prague criteria present in the distal esophagus. The maximum       longitudinal extent of these mucosal changes was 1 cm in length. Mucosa       was biopsied with a cold forceps for histology in a targeted manner and       in 4 quadrants at intervals of 1 cm.      The exam of the esophagus was otherwise normal.      Patchy mildly erythematous mucosa without bleeding was found in the       gastric fundus. Biopsies were taken with a cold forceps for histology.       Biopsies were obtained in the gastric fundus, in the gastric body, at       the incisura and in the gastric antrum with cold forceps for histology.      Two 4 mm sessile polyps with no bleeding and no stigmata of recent       bleeding were found in the gastric fundus. Biopsies were taken with a       cold forceps for histology.      The exam of the stomach was otherwise normal.      The duodenal bulb, second portion of the duodenum and examined duodenum       were normal. Impression:            - Esophageal mucosal changes classified as Barrett's                         stage C0-M1 per Prague criteria. Biopsied.                        - Erythematous mucosa in the gastric fundus. Biopsied.                        - Two  gastric polyps. Biopsied.                        - Normal duodenal bulb, second portion of the duodenum                         and examined duodenum.                        - Biopsies were obtained in the gastric fundus, in the                         gastric body, at the incisura  and in the gastric                         antrum. Recommendation:        - Await pathology results.                        - Follow an antireflux regimen.                        - Discharge patient to home (with escort).                        - Advance diet as tolerated.                        - Continue present medications.                        - Patient has a contact number available for                         emergencies. The signs and symptoms of potential                         delayed complications were discussed with the patient.                         Return to normal activities tomorrow. Written                         discharge instructions were provided to the patient.                        - Discharge patient to home (with escort).                        - The findings and recommendations were discussed with                         the patient.                        - The findings and recommendations were discussed with                         the patient's family. Procedure Code(s):     --- Professional ---  79480, Esophagogastroduodenoscopy, flexible,                         transoral; with biopsy, single or multiple Diagnosis Code(s):     --- Professional ---                        K22.70, Barrett's esophagus without dysplasia                        K31.89, Other diseases of stomach and duodenum                        K31.7, Polyp of stomach and duodenum CPT copyright 2019 [employer]. All rights reserved. The codes documented in this report are preliminary and upon coder review may  be revised to meet current compliance requirements.  Vonda Antigua, MD Margretta Sidle B. Bonna Gains MD, MD 10/02/2021 12:06:08 PM This report has been signed electronically. Number of Addenda: 0 Note Initiated On: 10/02/2021 11:26 AM Estimated Blood Loss:  Estimated blood loss: none.      [facility]

## 2021-10-06 ENCOUNTER — Encounter: Payer: Self-pay | Admitting: Gastroenterology

## 2021-10-07 LAB — SURGICAL PATHOLOGY

## 2021-10-08 ENCOUNTER — Encounter: Payer: Self-pay | Admitting: Gastroenterology

## 2023-10-25 ENCOUNTER — Other Ambulatory Visit: Payer: Self-pay | Admitting: Internal Medicine

## 2023-10-25 ENCOUNTER — Encounter: Payer: Self-pay | Admitting: Internal Medicine

## 2023-10-25 DIAGNOSIS — Z0000 Encounter for general adult medical examination without abnormal findings: Secondary | ICD-10-CM

## 2023-10-25 DIAGNOSIS — E785 Hyperlipidemia, unspecified: Secondary | ICD-10-CM

## 2023-11-01 ENCOUNTER — Ambulatory Visit
Admission: RE | Admit: 2023-11-01 | Discharge: 2023-11-01 | Disposition: A | Discharge status: Home / Self Care | Payer: Self-pay | Source: Ambulatory Visit | Attending: Internal Medicine | Admitting: Internal Medicine

## 2023-11-01 DIAGNOSIS — E785 Hyperlipidemia, unspecified: Secondary | ICD-10-CM | POA: Insufficient documentation

## 2023-11-01 DIAGNOSIS — Z0000 Encounter for general adult medical examination without abnormal findings: Secondary | ICD-10-CM | POA: Insufficient documentation

## 2024-02-17 ENCOUNTER — Other Ambulatory Visit: Payer: Self-pay | Admitting: Pulmonary Disease

## 2024-02-17 DIAGNOSIS — J9859 Other diseases of mediastinum, not elsewhere classified: Secondary | ICD-10-CM

## 2024-02-20 ENCOUNTER — Ambulatory Visit
Admission: RE | Admit: 2024-02-20 | Discharge: 2024-02-20 | Disposition: A | Discharge status: Home / Self Care | Source: Ambulatory Visit | Attending: Pulmonary Disease | Admitting: Pulmonary Disease

## 2024-02-20 DIAGNOSIS — J9859 Other diseases of mediastinum, not elsewhere classified: Secondary | ICD-10-CM

## 2024-03-22 ENCOUNTER — Ambulatory Visit (INDEPENDENT_AMBULATORY_CARE_PROVIDER_SITE_OTHER): Payer: Self-pay

## 2024-03-22 DIAGNOSIS — Z09 Encounter for follow-up examination after completed treatment for conditions other than malignant neoplasm: Secondary | ICD-10-CM | POA: Diagnosis present

## 2024-03-22 DIAGNOSIS — K640 First degree hemorrhoids: Secondary | ICD-10-CM | POA: Diagnosis not present

## 2024-03-22 DIAGNOSIS — Z860101 Personal history of adenomatous and serrated colon polyps: Secondary | ICD-10-CM | POA: Diagnosis not present

## 2024-03-22 DIAGNOSIS — K2270 Barrett's esophagus without dysplasia: Secondary | ICD-10-CM | POA: Diagnosis not present
